# Patient Record
Sex: Male | Born: 2000 | Hispanic: Yes | Marital: Single | Smoking: Never smoker
Health system: Southern US, Community
[De-identification: ages and names within clinical notes are randomized; demographics above are authoritative.]

---
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
## 2021-01-06 DIAGNOSIS — Z419 Encounter for procedure for purposes other than remedying health state, unspecified: Secondary | ICD-10-CM | POA: Diagnosis not present

## 2021-02-06 DIAGNOSIS — Z419 Encounter for procedure for purposes other than remedying health state, unspecified: Secondary | ICD-10-CM | POA: Diagnosis not present

## 2021-03-08 DIAGNOSIS — Z419 Encounter for procedure for purposes other than remedying health state, unspecified: Secondary | ICD-10-CM | POA: Diagnosis not present

## 2021-04-08 DIAGNOSIS — Z419 Encounter for procedure for purposes other than remedying health state, unspecified: Secondary | ICD-10-CM | POA: Diagnosis not present

## 2021-05-08 DIAGNOSIS — Z419 Encounter for procedure for purposes other than remedying health state, unspecified: Secondary | ICD-10-CM | POA: Diagnosis not present

## 2021-06-08 DIAGNOSIS — Z419 Encounter for procedure for purposes other than remedying health state, unspecified: Secondary | ICD-10-CM | POA: Diagnosis not present

## 2021-07-09 DIAGNOSIS — Z419 Encounter for procedure for purposes other than remedying health state, unspecified: Secondary | ICD-10-CM | POA: Diagnosis not present

## 2021-08-08 DIAGNOSIS — Z419 Encounter for procedure for purposes other than remedying health state, unspecified: Secondary | ICD-10-CM | POA: Diagnosis not present

## 2021-09-08 DIAGNOSIS — Z419 Encounter for procedure for purposes other than remedying health state, unspecified: Secondary | ICD-10-CM | POA: Diagnosis not present

## 2021-10-08 DIAGNOSIS — Z419 Encounter for procedure for purposes other than remedying health state, unspecified: Secondary | ICD-10-CM | POA: Diagnosis not present

## 2021-11-08 DIAGNOSIS — Z419 Encounter for procedure for purposes other than remedying health state, unspecified: Secondary | ICD-10-CM | POA: Diagnosis not present

## 2021-12-09 DIAGNOSIS — Z419 Encounter for procedure for purposes other than remedying health state, unspecified: Secondary | ICD-10-CM | POA: Diagnosis not present

## 2022-01-06 DIAGNOSIS — Z419 Encounter for procedure for purposes other than remedying health state, unspecified: Secondary | ICD-10-CM | POA: Diagnosis not present

## 2022-02-06 DIAGNOSIS — Z419 Encounter for procedure for purposes other than remedying health state, unspecified: Secondary | ICD-10-CM | POA: Diagnosis not present

## 2022-03-08 DIAGNOSIS — Z419 Encounter for procedure for purposes other than remedying health state, unspecified: Secondary | ICD-10-CM | POA: Diagnosis not present

## 2022-04-08 DIAGNOSIS — Z419 Encounter for procedure for purposes other than remedying health state, unspecified: Secondary | ICD-10-CM | POA: Diagnosis not present

## 2022-05-08 DIAGNOSIS — Z419 Encounter for procedure for purposes other than remedying health state, unspecified: Secondary | ICD-10-CM | POA: Diagnosis not present

## 2022-06-08 DIAGNOSIS — Z419 Encounter for procedure for purposes other than remedying health state, unspecified: Secondary | ICD-10-CM | POA: Diagnosis not present

## 2022-07-09 DIAGNOSIS — Z419 Encounter for procedure for purposes other than remedying health state, unspecified: Secondary | ICD-10-CM | POA: Diagnosis not present

## 2022-08-08 DIAGNOSIS — Z419 Encounter for procedure for purposes other than remedying health state, unspecified: Secondary | ICD-10-CM | POA: Diagnosis not present

## 2022-09-08 DIAGNOSIS — Z419 Encounter for procedure for purposes other than remedying health state, unspecified: Secondary | ICD-10-CM | POA: Diagnosis not present

## 2022-10-08 DIAGNOSIS — Z419 Encounter for procedure for purposes other than remedying health state, unspecified: Secondary | ICD-10-CM | POA: Diagnosis not present

## 2022-11-08 DIAGNOSIS — Z419 Encounter for procedure for purposes other than remedying health state, unspecified: Secondary | ICD-10-CM | POA: Diagnosis not present

## 2022-12-09 DIAGNOSIS — Z419 Encounter for procedure for purposes other than remedying health state, unspecified: Secondary | ICD-10-CM | POA: Diagnosis not present

## 2023-01-07 DIAGNOSIS — Z419 Encounter for procedure for purposes other than remedying health state, unspecified: Secondary | ICD-10-CM | POA: Diagnosis not present

## 2023-02-07 DIAGNOSIS — Z419 Encounter for procedure for purposes other than remedying health state, unspecified: Secondary | ICD-10-CM | POA: Diagnosis not present

## 2023-03-09 DIAGNOSIS — Z419 Encounter for procedure for purposes other than remedying health state, unspecified: Secondary | ICD-10-CM | POA: Diagnosis not present

## 2023-04-09 DIAGNOSIS — Z419 Encounter for procedure for purposes other than remedying health state, unspecified: Secondary | ICD-10-CM | POA: Diagnosis not present

## 2023-05-09 DIAGNOSIS — Z419 Encounter for procedure for purposes other than remedying health state, unspecified: Secondary | ICD-10-CM | POA: Diagnosis not present

## 2023-06-09 DIAGNOSIS — Z419 Encounter for procedure for purposes other than remedying health state, unspecified: Secondary | ICD-10-CM | POA: Diagnosis not present

## 2023-07-10 DIAGNOSIS — Z419 Encounter for procedure for purposes other than remedying health state, unspecified: Secondary | ICD-10-CM | POA: Diagnosis not present

## 2023-08-09 DIAGNOSIS — Z419 Encounter for procedure for purposes other than remedying health state, unspecified: Secondary | ICD-10-CM | POA: Diagnosis not present

## 2023-09-09 DIAGNOSIS — Z419 Encounter for procedure for purposes other than remedying health state, unspecified: Secondary | ICD-10-CM | POA: Diagnosis not present

## 2023-10-09 DIAGNOSIS — Z419 Encounter for procedure for purposes other than remedying health state, unspecified: Secondary | ICD-10-CM | POA: Diagnosis not present

## 2023-11-09 DIAGNOSIS — Z419 Encounter for procedure for purposes other than remedying health state, unspecified: Secondary | ICD-10-CM | POA: Diagnosis not present

## 2023-11-12 ENCOUNTER — Emergency Department
Admission: EM | Admit: 2023-11-12 | Discharge: 2023-11-12 | Disposition: A | Discharge status: Home / Self Care | Payer: Self-pay | Attending: Emergency Medicine | Admitting: Emergency Medicine

## 2023-11-12 ENCOUNTER — Other Ambulatory Visit: Payer: Self-pay

## 2023-11-12 DIAGNOSIS — Z0279 Encounter for issue of other medical certificate: Secondary | ICD-10-CM | POA: Insufficient documentation

## 2023-11-12 DIAGNOSIS — F10920 Alcohol use, unspecified with intoxication, uncomplicated: Secondary | ICD-10-CM | POA: Insufficient documentation

## 2023-11-12 NOTE — Discharge Instructions (Signed)
Return to the ER for new or worsening symptoms. °

## 2023-11-12 NOTE — ED Triage Notes (Signed)
 Pt presents to ED with c/o of needing medical clearance due to ETOH, Pt ambulatory to triage, pt calm and cooperative. Dr Rosalia Hammers asked to clear pt.

## 2023-11-12 NOTE — ED Provider Notes (Addendum)
   [facility] Provider Note    Event Date/Time   First MD Initiated Contact with Patient 11/12/23 1139     (approximate)   History   Medical Clearence    HPI  Zachary Coffey is a 23 year old male presenting to the emergency department for medical clearance in the setting of alcohol intoxication.  Arrives with police department.  Tells me that he has been drinking since the morning.  Denies any falls or head trauma.  Denies pain anywhere.  Police report they bring him in in the setting of his alcohol use today for medical clearance.     Physical Exam   Triage Vital Signs: ED Triage Vitals [11/12/23 1148]  Encounter Vitals Group     BP      Systolic BP Percentile      Diastolic BP Percentile      Pulse      Resp      Temp      Temp src      SpO2      Weight 224 lb 13.9 oz (102 kg)     Height 5' 8 (1.727 m)     Head Circumference      Peak Flow      Pain Score 0     Pain Loc      Pain Education      Exclude from Growth Chart     Most recent vital signs: Vitals:   11/12/23 1154  BP: (!) 157/88  Pulse: (!) 110  Resp: 18  Temp: 97.7 F (36.5 C)  SpO2: 99%    General: Awake, interactive  CV:  Mild tachycardia, good peripheral perfusion Resp:  Unlabored respirations, lungs clear to auscultation Abd:  Nondistended.  Neuro:  Symmetric facial movement, mildly slurred speech but speaking in full sentences appropriate to context, moving extremity spontaneously and equally   ED Results / Procedures / Treatments   Labs (all labs ordered are listed, but only abnormal results are displayed) Labs Reviewed - No data to display   EKG EKG independently reviewed interpreted by myself (ER attending) demonstrates:    RADIOLOGY Imaging independently reviewed and interpreted by myself demonstrates:    PROCEDURES:  Critical Care performed: No  Procedures   MEDICATIONS ORDERED IN ED: Medications - No data to  display   IMPRESSION / MDM / ASSESSMENT AND PLAN / ED COURSE  I reviewed the triage vital signs and the nursing notes.  Differential diagnosis includes, but is not limited to, acute alcohol intoxication, no history of trauma, no physical complaint suggestive of other pathology  Patient's presentation is most consistent with acute, uncomplicated illness.  23 year old male presenting to the ER for medical clearance in the setting of alcohol intoxication.  No evidence of trauma.  Exam consistent with acute alcohol intoxication, but patient is awake, speaking in sentences.  Do think he is stable to be discharged in police custody.  Strict return precautions provided.      FINAL CLINICAL IMPRESSION(S) / ED DIAGNOSES   Final diagnoses:  Acute alcoholic intoxication without complication (HCC)     Rx / DC Orders   ED Discharge Orders     None        Note:  This document was prepared using Dragon voice recognition software and may include unintentional dictation errors.   Levander Slate, MD 11/12/23 1156    Levander Slate, MD 11/12/23 [phone]

## 2023-12-10 DIAGNOSIS — Z419 Encounter for procedure for purposes other than remedying health state, unspecified: Secondary | ICD-10-CM | POA: Diagnosis not present

## 2024-01-07 DIAGNOSIS — Z419 Encounter for procedure for purposes other than remedying health state, unspecified: Secondary | ICD-10-CM | POA: Diagnosis not present

## 2024-02-18 DIAGNOSIS — Z419 Encounter for procedure for purposes other than remedying health state, unspecified: Secondary | ICD-10-CM | POA: Diagnosis not present

## 2024-03-19 DIAGNOSIS — Z419 Encounter for procedure for purposes other than remedying health state, unspecified: Secondary | ICD-10-CM | POA: Diagnosis not present

## 2024-05-19 ENCOUNTER — Telehealth: Payer: Self-pay | Admitting: Emergency Medicine

## 2024-05-19 ENCOUNTER — Other Ambulatory Visit: Payer: Self-pay

## 2024-05-19 ENCOUNTER — Emergency Department
Admission: EM | Admit: 2024-05-19 | Discharge: 2024-05-19 | Disposition: A | Discharge status: Home / Self Care | Payer: Self-pay | Attending: Emergency Medicine | Admitting: Emergency Medicine

## 2024-05-19 DIAGNOSIS — F1010 Alcohol abuse, uncomplicated: Secondary | ICD-10-CM | POA: Insufficient documentation

## 2024-05-19 DIAGNOSIS — Y908 Blood alcohol level of 240 mg/100 ml or more: Secondary | ICD-10-CM | POA: Insufficient documentation

## 2024-05-19 LAB — COMPREHENSIVE METABOLIC PANEL WITH GFR
ALT: 44 U/L (ref 0–44)
AST: 30 U/L (ref 15–41)
Albumin: 5.1 g/dL — ABNORMAL HIGH (ref 3.5–5.0)
Alkaline Phosphatase: 67 U/L (ref 38–126)
Anion gap: 13 (ref 5–15)
BUN: 7 mg/dL (ref 6–20)
CO2: 22 mmol/L (ref 22–32)
Calcium: 8.6 mg/dL — ABNORMAL LOW (ref 8.9–10.3)
Chloride: 110 mmol/L (ref 98–111)
Creatinine, Ser: 0.6 mg/dL — ABNORMAL LOW (ref 0.61–1.24)
GFR, Estimated: >60 mL/min (ref >60)
Glucose, Bld: 120 mg/dL — ABNORMAL HIGH (ref 70–99)
Potassium: 3.6 mmol/L (ref 3.5–5.1)
Sodium: 145 mmol/L (ref 135–145)
Total Bilirubin: 0.8 mg/dL (ref 0.0–1.2)
Total Protein: 8.6 g/dL — ABNORMAL HIGH (ref 6.5–8.1)

## 2024-05-19 LAB — CBC
HCT: 49.1 % (ref 39.0–52.0)
Hemoglobin: 17.3 g/dL — ABNORMAL HIGH (ref 13.0–17.0)
MCH: 30.7 pg (ref 26.0–34.0)
MCHC: 35.2 g/dL (ref 30.0–36.0)
MCV: 87.1 fL (ref 80.0–100.0)
Platelets: 301 K/uL (ref 150–400)
RBC: 5.64 MIL/uL (ref 4.22–5.81)
RDW: 12 % (ref 11.5–15.5)
WBC: 7.2 K/uL (ref 4.0–10.5)
nRBC: 0 % (ref 0.0–0.2)

## 2024-05-19 LAB — ETHANOL: Alcohol, Ethyl (B): 365 mg/dL (ref <15)

## 2024-05-19 NOTE — Telephone Encounter (Signed)
 This RN received phone call from lab with ETOH 365. EDP Stafford made aware, no new orders, pt discharged in custody of [employer].

## 2024-05-19 NOTE — ED Triage Notes (Signed)
 To ED with [employer] for medical clearance to go to jail. ETOH level was .34. Daily drinker, 2 bottles liquor. Pt is calm and cooperative in triage.

## 2024-05-19 NOTE — ED Provider Notes (Signed)
 [facility] Provider Note    Event Date/Time   First MD Initiated Contact with Patient 05/19/24 1901     (approximate)   History   Medical Clearance   HPI  Zachary Coffey is a 23 y.o. male with a history of alcohol abuse who was brought to the ED for medical screening exam due to elevated blood alcohol level.  Please officer accompanying him reports that patient was arrested due to violation of the court order, breathalyzer test revealed a level of 0.33, and per jail policies, patient needs medical clearance if the level is not below 0.30.  Patient denies any complaints, no chest pain shortness of breath belly pain, no vomiting, no black or bloody stool.  Reports he typically drinks 2 bottles of liquor a day.  Denies any shakiness or sweating or hallucinations, feels calm.     Physical Exam   Triage Vital Signs: ED Triage Vitals  Encounter Vitals Group     BP 05/19/24 1851 (!) 154/113     Girls Systolic BP Percentile --      Girls Diastolic BP Percentile --      Boys Systolic BP Percentile --      Boys Diastolic BP Percentile --      Pulse Rate 05/19/24 1851 (!) 115     Resp 05/19/24 1851 20     Temp 05/19/24 1851 98.8 F (37.1 C)     Temp Source 05/19/24 1851 Oral     SpO2 05/19/24 1851 98 %     Weight 05/19/24 1848 240 lb (108.9 kg)     Height 05/19/24 1848 5' 11 (1.803 m)     Head Circumference --      Peak Flow --      Pain Score 05/19/24 1849 0     Pain Loc --      Pain Education --      Exclude from Growth Chart --     Most recent vital signs: Vitals:   05/19/24 1851  BP: (!) 154/113  Pulse: (!) 115  Resp: 20  Temp: 98.8 F (37.1 C)  SpO2: 98%    General: Awake, no distress.  Calm, clear speech CV:  Good peripheral perfusion.  Regular rate rhythm, heart rate 100 Resp:  Normal effort.  Clear to auscultation Abd:  No distention.  Soft nontender Other:  Cranial nerves II through XII intact.  Steady gait.   ED  Results / Procedures / Treatments   Labs (all labs ordered are listed, but only abnormal results are displayed) Labs Reviewed  CBC - Abnormal; Notable for the following components:      Result Value   Hemoglobin 17.3 (*)    All other components within normal limits  COMPREHENSIVE METABOLIC PANEL WITH GFR  ETHANOL  URINE DRUG SCREEN, QUALITATIVE ([facility] ONLY)     RADIOLOGY    PROCEDURES:  Procedures   MEDICATIONS ORDERED IN ED: Medications - No data to display   IMPRESSION / MDM / ASSESSMENT AND PLAN / ED COURSE  I reviewed the triage vital signs and the nursing notes.                              Patient brought to the ED for medical clearance due to elevated alcohol level.  He has no signs of intoxication or withdrawal currently.  Patient wishes to be discharged from the ED, and is medically stable  FINAL CLINICAL IMPRESSION(S) / ED DIAGNOSES   Final diagnoses:  Alcohol abuse     Rx / DC Orders   ED Discharge Orders     None        Note:  This document was prepared using Dragon voice recognition software and may include unintentional dictation errors.   Viviann Pastor, MD 05/19/24 [phone]

## 2024-05-19 NOTE — ED Notes (Signed)
 Due to patient's intoxication patient here for medical clearance.  Patient denies any medical problems, denies si/hi.
